# Patient Record
Sex: Male | Born: 1960 | Race: White | Hispanic: No | Marital: Married | Smoking: Never smoker
Health system: Southern US, Community
[De-identification: ages and names within clinical notes are randomized; demographics above are authoritative.]

## PROBLEM LIST (undated history)

## (undated) DIAGNOSIS — D649 Anemia, unspecified: Secondary | ICD-10-CM

## (undated) DIAGNOSIS — D6861 Antiphospholipid syndrome: Secondary | ICD-10-CM

## (undated) DIAGNOSIS — M948X9 Other specified disorders of cartilage, unspecified sites: Secondary | ICD-10-CM

## (undated) DIAGNOSIS — G4730 Sleep apnea, unspecified: Secondary | ICD-10-CM

## (undated) DIAGNOSIS — J45909 Unspecified asthma, uncomplicated: Secondary | ICD-10-CM

## (undated) DIAGNOSIS — Z5189 Encounter for other specified aftercare: Secondary | ICD-10-CM

## (undated) DIAGNOSIS — Z86711 Personal history of pulmonary embolism: Secondary | ICD-10-CM

## (undated) HISTORY — PX: KNEE SURGERY: SHX244

## (undated) HISTORY — DX: Unspecified asthma, uncomplicated: J45.909

## (undated) HISTORY — DX: Antiphospholipid syndrome: D68.61

## (undated) HISTORY — PX: SHOULDER SURGERY: SHX246

## (undated) HISTORY — PX: APPENDECTOMY: SHX54

## (undated) HISTORY — PX: COLONOSCOPY: SHX174

## (undated) HISTORY — PX: CHOLECYSTECTOMY: SHX55

## (undated) HISTORY — DX: Anemia, unspecified: D64.9

## (undated) HISTORY — DX: Personal history of pulmonary embolism: Z86.711

## (undated) HISTORY — DX: Encounter for other specified aftercare: Z51.89

## (undated) HISTORY — DX: Sleep apnea, unspecified: G47.30

---
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
## 2004-03-09 ENCOUNTER — Ambulatory Visit (HOSPITAL_COMMUNITY): Admission: RE | Admit: 2004-03-09 | Discharge: 2004-03-09 | Payer: Self-pay | Admitting: Sports Medicine

## 2004-05-04 ENCOUNTER — Ambulatory Visit (HOSPITAL_BASED_OUTPATIENT_CLINIC_OR_DEPARTMENT_OTHER): Admission: RE | Admit: 2004-05-04 | Discharge: 2004-05-04 | Payer: Self-pay | Admitting: Orthopedic Surgery

## 2004-05-04 ENCOUNTER — Ambulatory Visit (HOSPITAL_COMMUNITY): Admission: RE | Admit: 2004-05-04 | Discharge: 2004-05-04 | Payer: Self-pay | Admitting: Orthopedic Surgery

## 2006-11-01 ENCOUNTER — Ambulatory Visit (HOSPITAL_COMMUNITY): Admission: RE | Admit: 2006-11-01 | Discharge: 2006-11-01 | Payer: Self-pay | Admitting: Emergency Medicine

## 2006-11-20 ENCOUNTER — Ambulatory Visit: Payer: Self-pay | Admitting: Internal Medicine

## 2006-12-11 ENCOUNTER — Ambulatory Visit: Payer: Self-pay | Admitting: Internal Medicine

## 2007-01-10 ENCOUNTER — Ambulatory Visit: Payer: Self-pay | Admitting: Internal Medicine

## 2007-02-10 ENCOUNTER — Ambulatory Visit: Payer: Self-pay | Admitting: Internal Medicine

## 2007-02-14 ENCOUNTER — Ambulatory Visit (HOSPITAL_COMMUNITY): Admission: RE | Admit: 2007-02-14 | Discharge: 2007-02-14 | Payer: Self-pay | Admitting: Internal Medicine

## 2007-03-12 ENCOUNTER — Ambulatory Visit: Payer: Self-pay | Admitting: Internal Medicine

## 2007-04-12 ENCOUNTER — Ambulatory Visit: Payer: Self-pay | Admitting: Internal Medicine

## 2007-05-13 ENCOUNTER — Ambulatory Visit: Payer: Self-pay | Admitting: Internal Medicine

## 2007-06-12 ENCOUNTER — Ambulatory Visit: Payer: Self-pay | Admitting: Internal Medicine

## 2007-07-13 ENCOUNTER — Ambulatory Visit: Payer: Self-pay | Admitting: Internal Medicine

## 2007-08-12 ENCOUNTER — Ambulatory Visit: Payer: Self-pay | Admitting: Internal Medicine

## 2007-08-15 ENCOUNTER — Encounter: Payer: Self-pay | Admitting: Internal Medicine

## 2007-08-15 ENCOUNTER — Ambulatory Visit: Payer: Self-pay | Admitting: Gastroenterology

## 2007-09-11 ENCOUNTER — Encounter: Payer: Self-pay | Admitting: Internal Medicine

## 2007-09-11 ENCOUNTER — Observation Stay: Payer: Self-pay | Admitting: Internal Medicine

## 2007-09-12 ENCOUNTER — Ambulatory Visit: Payer: Self-pay | Admitting: Internal Medicine

## 2007-09-12 DIAGNOSIS — M948X9 Other specified disorders of cartilage, unspecified sites: Secondary | ICD-10-CM

## 2007-09-12 HISTORY — DX: Other specified disorders of cartilage, unspecified sites: M94.8X9

## 2007-09-16 ENCOUNTER — Encounter: Payer: Self-pay | Admitting: Internal Medicine

## 2007-09-16 ENCOUNTER — Ambulatory Visit (HOSPITAL_COMMUNITY): Admission: RE | Admit: 2007-09-16 | Discharge: 2007-09-16 | Payer: Self-pay | Admitting: Internal Medicine

## 2007-09-17 ENCOUNTER — Ambulatory Visit: Payer: Self-pay | Admitting: Vascular Surgery

## 2007-09-17 ENCOUNTER — Ambulatory Visit (HOSPITAL_COMMUNITY): Admission: RE | Admit: 2007-09-17 | Discharge: 2007-09-17 | Payer: Self-pay | Admitting: Internal Medicine

## 2007-09-23 ENCOUNTER — Encounter: Payer: Self-pay | Admitting: Internal Medicine

## 2007-09-24 ENCOUNTER — Encounter: Payer: Self-pay | Admitting: Internal Medicine

## 2007-09-24 ENCOUNTER — Ambulatory Visit (HOSPITAL_COMMUNITY): Admission: RE | Admit: 2007-09-24 | Discharge: 2007-09-24 | Payer: Self-pay | Admitting: Internal Medicine

## 2007-10-01 ENCOUNTER — Encounter: Payer: Self-pay | Admitting: Internal Medicine

## 2007-10-02 ENCOUNTER — Encounter: Payer: Self-pay | Admitting: Internal Medicine

## 2007-10-08 ENCOUNTER — Encounter: Payer: Self-pay | Admitting: Internal Medicine

## 2007-10-13 ENCOUNTER — Ambulatory Visit: Payer: Self-pay | Admitting: Internal Medicine

## 2007-11-01 ENCOUNTER — Ambulatory Visit: Payer: Self-pay | Admitting: Internal Medicine

## 2007-11-08 ENCOUNTER — Inpatient Hospital Stay (HOSPITAL_COMMUNITY): Admission: EM | Admit: 2007-11-08 | Discharge: 2007-11-09 | Payer: Self-pay | Admitting: Emergency Medicine

## 2007-11-10 ENCOUNTER — Ambulatory Visit: Payer: Self-pay | Admitting: Internal Medicine

## 2007-11-12 ENCOUNTER — Encounter: Payer: Self-pay | Admitting: Internal Medicine

## 2007-12-11 ENCOUNTER — Ambulatory Visit: Payer: Self-pay | Admitting: Internal Medicine

## 2008-01-10 ENCOUNTER — Ambulatory Visit: Payer: Self-pay | Admitting: Internal Medicine

## 2008-02-10 ENCOUNTER — Ambulatory Visit: Payer: Self-pay | Admitting: Internal Medicine

## 2008-02-18 ENCOUNTER — Encounter: Payer: Self-pay | Admitting: Internal Medicine

## 2008-02-19 IMAGING — CT CT CHEST W/ CM
3 series · 18 of 29 positions shown, 19 images · IV contrast (100 ML OMNI 300)
Comparison: Prior CTs of the chest, abdomen, and pelvis done on 02/14/07. There are no chest radiographs available for correlation.

CLINICAL DATA: Hemolytic anemia and fever.  Right pleural based nodule on chest x-ray 09/11/07.
CHEST CT WITH CONTRAST ? 09/16/07:
TECHNIQUE: Multidetector CT imaging of the chest was performed following the standard protocol during bolus administration of intravenous contrast.   
Contrast: 100 cc Omnipaque 300 IV.

[Series 2: routine chest · axial · 0.76mm/px · z∈[-270,-106]mm · 4 of 67 slices shown, 5 images]
[im 17/67  mediastinal]
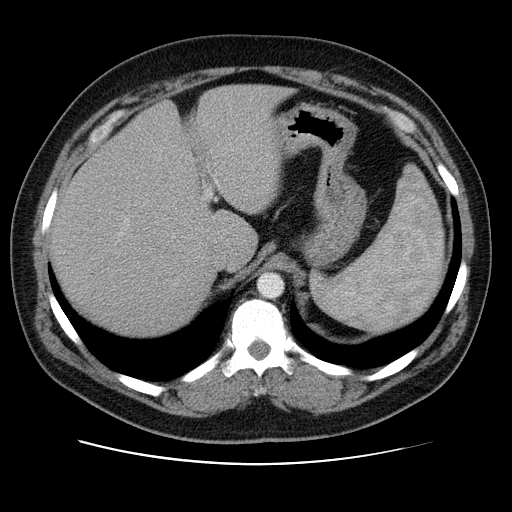
[im 17/67  lung]
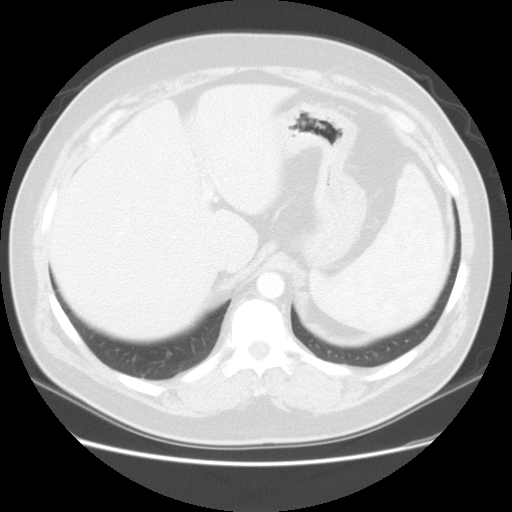
[im 34/67  lung]
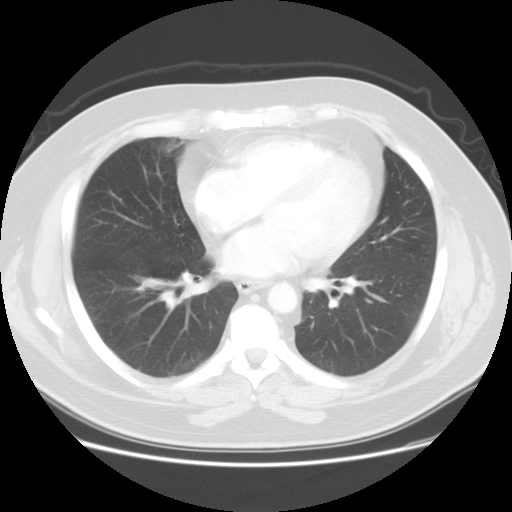
[im 36/67  lung]
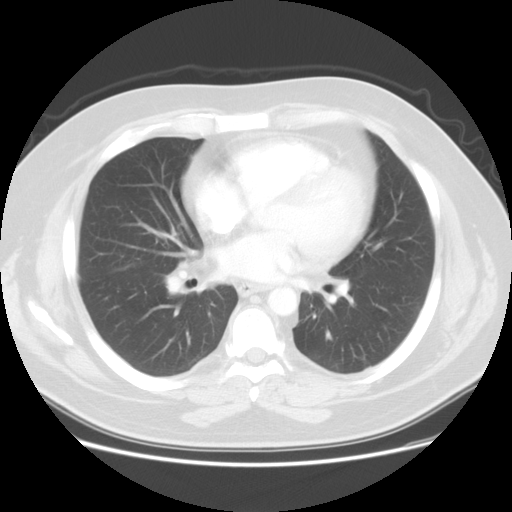
[im 50/67  lung]
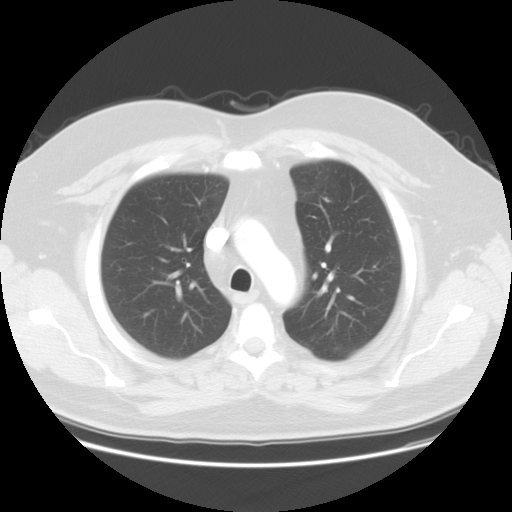

[Series 400: reformatted · coronal · 0.76mm/px · 6 of 154 slices shown (1 of 2)]
[im 18/154  lung]
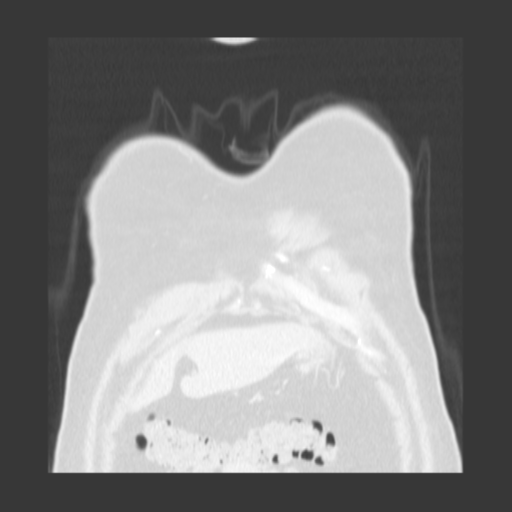
[im 35/154  lung]
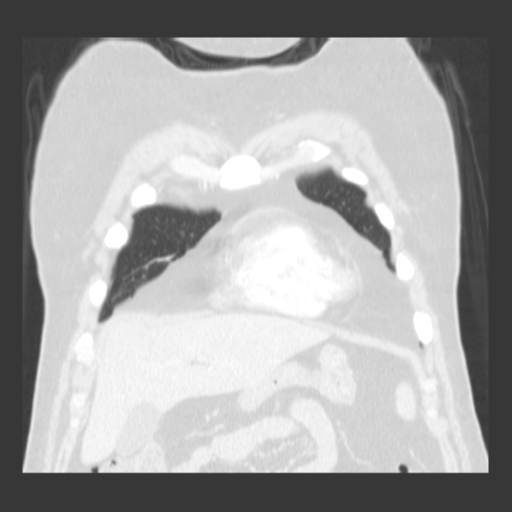
[im 52/154  lung]
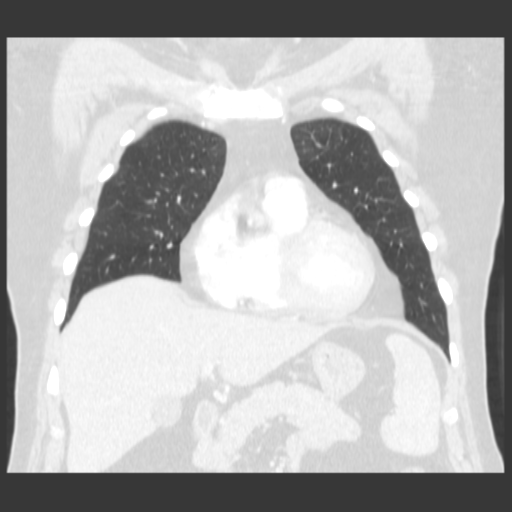
[im 69/154  lung]
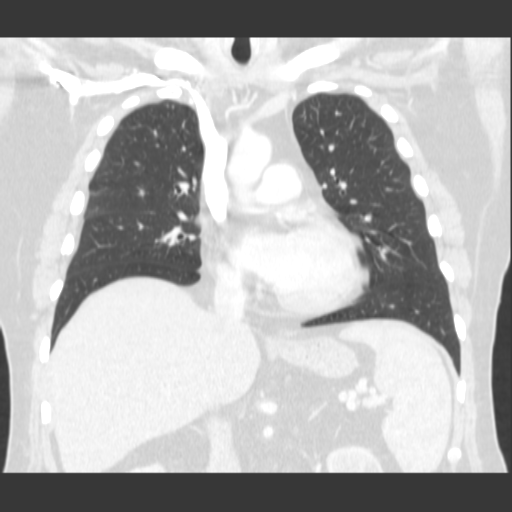
[im 86/154  lung]
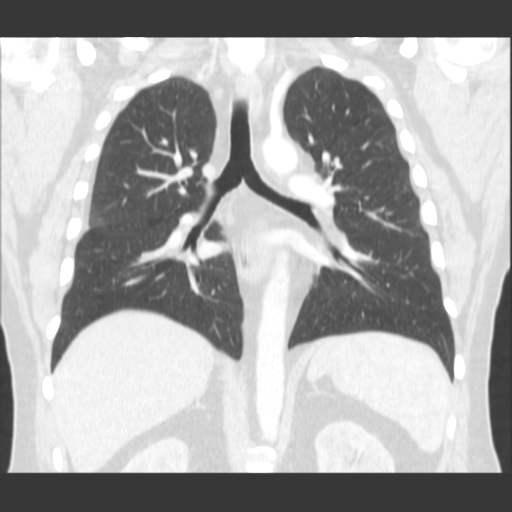
[im 103/154  lung]
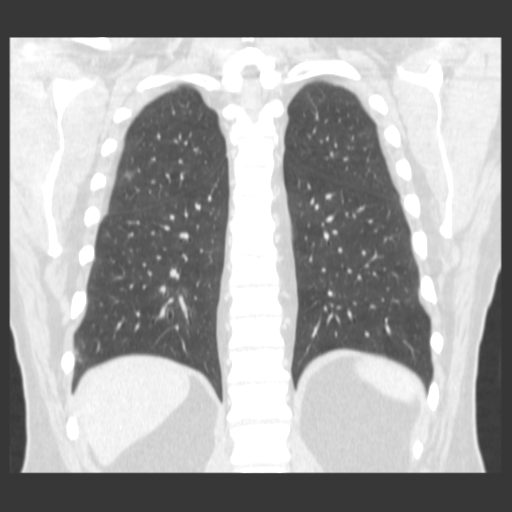

[Series 401: reformatted · sagittal · 0.76mm/px · 8 of 194 slices shown (2 of 2)]
[im 17/194  lung]
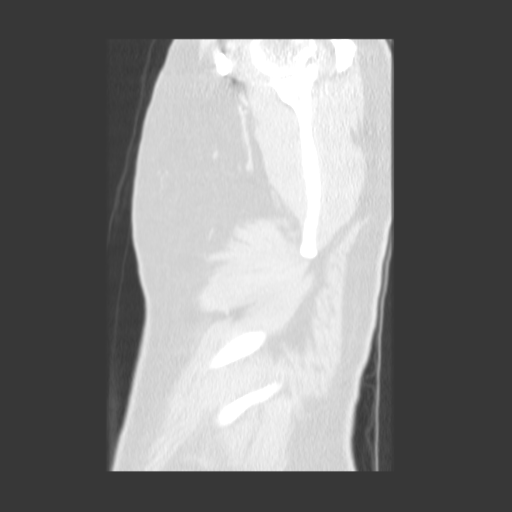
[im 49/194  lung]
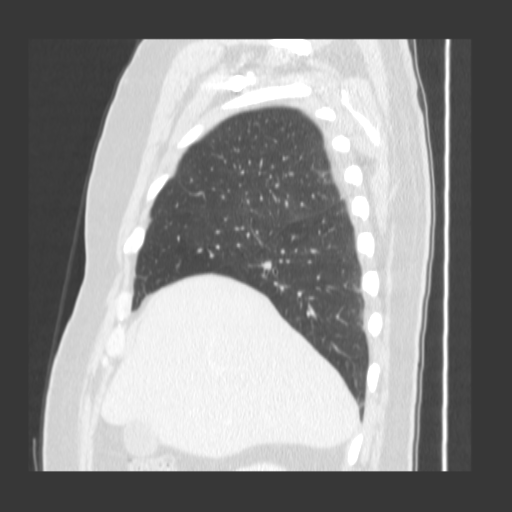
[im 65/194  lung]
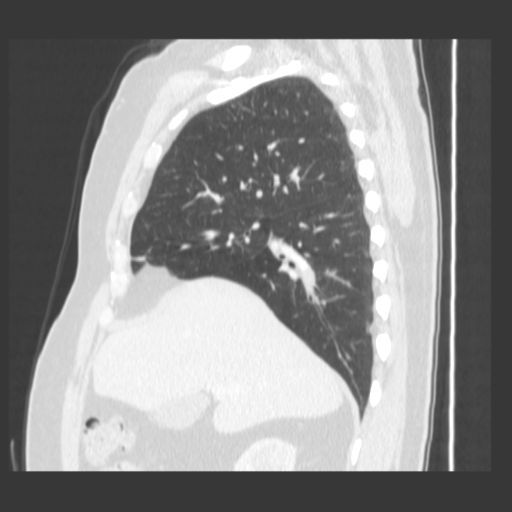
[im 81/194  lung]
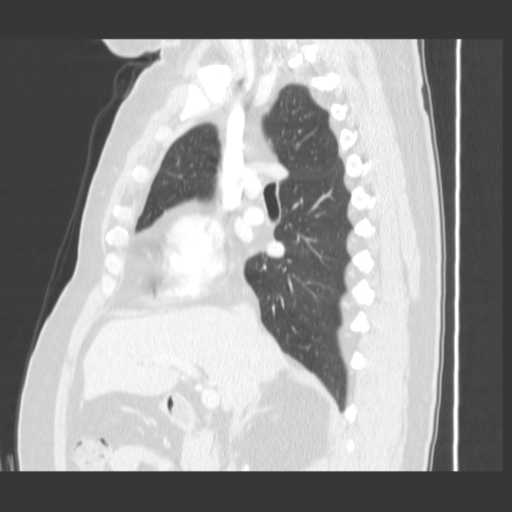
[im 113/194  lung]
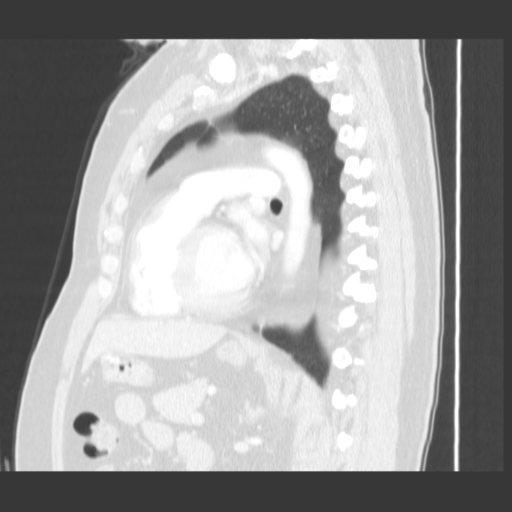
[im 129/194  lung]
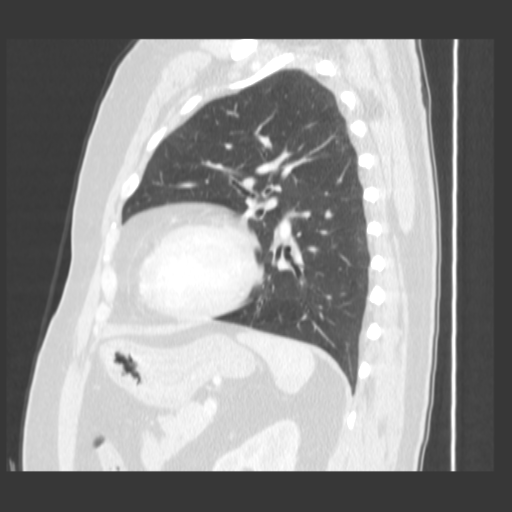
[im 145/194  lung]
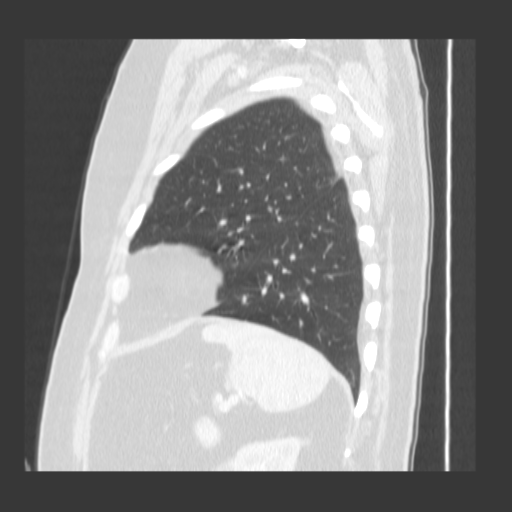
[im 177/194  lung]
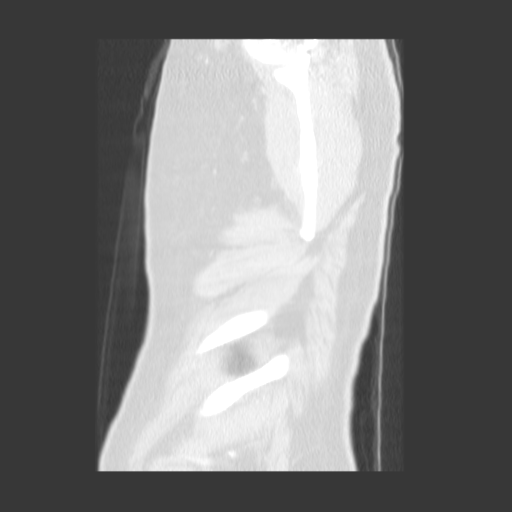

[18 of 29 positions shown; findings below may reference images not displayed]

FINDINGS: A new peripheral subpleural nodule is noted in the right upper lobe just above the minor fissure. This measures 1.7 x 0.9  cm transverse and is triangular in shape on the reformatted images.  Additional new smaller areas of subpleural density are present in the right lower lobe.  The left lung has a stable appearance. There is a small nodule medially in the left lower lobe on image #38 which is unchanged.
Although this study was not specifically designed to evaluate the pulmonary arteries, there is poor opacification of a right lower lobe pulmonary arterial branch on images #36-41. The vessel appears mildly expanded. This is confirmed on the reformatted images and compatible with pulmonary embolism, probably subacute in age.  No central pulmonary emboli are evident.  
There is no pleural or pericardial effusion.  No enlarged mediastinal or hilar lymph nodes are seen.
Images through the upper abdomen demonstrate no acute findings.  A cystic lesion in the midleft kidney is unchanged.
IMPRESSION: 1.  Pulmonary embolism on the right, probably subacute in age.
2.  Scattered subpleural nodular densities in the right upper and lower lobes are noted, probably reflecting pulmonary infarcts.  
3.  No adenopathy or other findings are demonstrated to suggest lymphoma.
4.  Dr. Klark shortly after completion of the examination.

## 2008-02-27 ENCOUNTER — Encounter: Payer: Self-pay | Admitting: Internal Medicine

## 2008-03-02 ENCOUNTER — Ambulatory Visit: Payer: Self-pay | Admitting: Family Medicine

## 2008-03-02 ENCOUNTER — Inpatient Hospital Stay (HOSPITAL_COMMUNITY): Admission: EM | Admit: 2008-03-02 | Discharge: 2008-03-05 | Payer: Self-pay | Admitting: Emergency Medicine

## 2008-03-05 ENCOUNTER — Encounter: Payer: Self-pay | Admitting: Internal Medicine

## 2008-03-11 ENCOUNTER — Ambulatory Visit: Payer: Self-pay | Admitting: Internal Medicine

## 2008-04-02 ENCOUNTER — Encounter: Payer: Self-pay | Admitting: Internal Medicine

## 2008-04-11 ENCOUNTER — Ambulatory Visit: Payer: Self-pay | Admitting: Internal Medicine

## 2008-05-12 ENCOUNTER — Ambulatory Visit: Payer: Self-pay | Admitting: Internal Medicine

## 2008-05-22 ENCOUNTER — Telehealth: Payer: Self-pay | Admitting: Internal Medicine

## 2008-06-08 ENCOUNTER — Ambulatory Visit: Payer: Self-pay | Admitting: Internal Medicine

## 2008-06-08 DIAGNOSIS — D591 Autoimmune hemolytic anemia, unspecified: Secondary | ICD-10-CM | POA: Insufficient documentation

## 2008-06-08 DIAGNOSIS — R197 Diarrhea, unspecified: Secondary | ICD-10-CM | POA: Insufficient documentation

## 2008-06-08 DIAGNOSIS — Z86718 Personal history of other venous thrombosis and embolism: Secondary | ICD-10-CM

## 2008-06-08 DIAGNOSIS — K649 Unspecified hemorrhoids: Secondary | ICD-10-CM | POA: Insufficient documentation

## 2008-06-09 ENCOUNTER — Encounter: Payer: Self-pay | Admitting: Internal Medicine

## 2008-06-11 ENCOUNTER — Ambulatory Visit: Payer: Self-pay | Admitting: Internal Medicine

## 2008-06-12 ENCOUNTER — Telehealth: Payer: Self-pay | Admitting: Internal Medicine

## 2008-06-12 ENCOUNTER — Ambulatory Visit: Payer: Self-pay | Admitting: Internal Medicine

## 2008-06-15 ENCOUNTER — Ambulatory Visit: Payer: Self-pay | Admitting: Internal Medicine

## 2008-06-15 ENCOUNTER — Telehealth: Payer: Self-pay | Admitting: Internal Medicine

## 2008-06-16 ENCOUNTER — Encounter: Payer: Self-pay | Admitting: Internal Medicine

## 2008-06-23 ENCOUNTER — Encounter: Payer: Self-pay | Admitting: Internal Medicine

## 2008-06-24 LAB — CONVERTED CEMR LAB: Tissue Transglutaminase Ab, IgA: 0.2 units (ref <7)

## 2008-06-26 ENCOUNTER — Telehealth: Payer: Self-pay | Admitting: Internal Medicine

## 2008-07-06 ENCOUNTER — Ambulatory Visit: Payer: Self-pay | Admitting: Internal Medicine

## 2008-07-09 ENCOUNTER — Telehealth: Payer: Self-pay | Admitting: Internal Medicine

## 2008-07-12 ENCOUNTER — Ambulatory Visit: Payer: Self-pay | Admitting: Internal Medicine

## 2008-07-14 ENCOUNTER — Encounter: Payer: Self-pay | Admitting: Internal Medicine

## 2008-07-14 ENCOUNTER — Telehealth: Payer: Self-pay | Admitting: Internal Medicine

## 2008-07-14 ENCOUNTER — Ambulatory Visit: Payer: Self-pay | Admitting: Internal Medicine

## 2008-07-16 ENCOUNTER — Telehealth: Payer: Self-pay | Admitting: Internal Medicine

## 2008-07-17 ENCOUNTER — Encounter: Payer: Self-pay | Admitting: Internal Medicine

## 2008-07-30 ENCOUNTER — Ambulatory Visit: Payer: Self-pay | Admitting: Oncology

## 2008-08-11 ENCOUNTER — Ambulatory Visit: Payer: Self-pay | Admitting: Internal Medicine

## 2008-08-13 ENCOUNTER — Encounter: Payer: Self-pay | Admitting: Internal Medicine

## 2008-08-28 ENCOUNTER — Encounter: Payer: Self-pay | Admitting: Internal Medicine

## 2008-09-11 ENCOUNTER — Ambulatory Visit: Payer: Self-pay | Admitting: Internal Medicine

## 2008-10-12 ENCOUNTER — Ambulatory Visit: Payer: Self-pay | Admitting: Internal Medicine

## 2008-10-13 ENCOUNTER — Encounter: Payer: Self-pay | Admitting: Internal Medicine

## 2008-12-10 ENCOUNTER — Encounter: Payer: Self-pay | Admitting: Internal Medicine

## 2008-12-16 ENCOUNTER — Ambulatory Visit: Payer: Self-pay | Admitting: Internal Medicine

## 2009-01-09 ENCOUNTER — Ambulatory Visit: Payer: Self-pay | Admitting: Internal Medicine

## 2009-02-09 ENCOUNTER — Encounter: Payer: Self-pay | Admitting: Internal Medicine

## 2009-06-29 ENCOUNTER — Encounter: Payer: Self-pay | Admitting: Internal Medicine

## 2009-10-01 ENCOUNTER — Ambulatory Visit: Payer: Self-pay | Admitting: Internal Medicine

## 2009-11-12 ENCOUNTER — Encounter (INDEPENDENT_AMBULATORY_CARE_PROVIDER_SITE_OTHER): Payer: Self-pay | Admitting: Surgery

## 2009-11-12 ENCOUNTER — Ambulatory Visit (HOSPITAL_COMMUNITY): Admission: RE | Admit: 2009-11-12 | Discharge: 2009-11-14 | Payer: Self-pay | Admitting: Surgery

## 2009-11-13 ENCOUNTER — Ambulatory Visit: Payer: Self-pay | Admitting: Gastroenterology

## 2009-12-14 ENCOUNTER — Encounter: Payer: Self-pay | Admitting: Gastroenterology

## 2010-01-27 ENCOUNTER — Encounter: Payer: Self-pay | Admitting: Internal Medicine

## 2010-02-10 ENCOUNTER — Ambulatory Visit: Payer: Self-pay | Admitting: Oncology

## 2010-02-27 ENCOUNTER — Encounter: Payer: Self-pay | Admitting: Internal Medicine

## 2010-02-28 ENCOUNTER — Telehealth: Payer: Self-pay | Admitting: Internal Medicine

## 2010-03-01 ENCOUNTER — Encounter: Payer: Self-pay | Admitting: Internal Medicine

## 2010-03-09 ENCOUNTER — Encounter (HOSPITAL_COMMUNITY): Admission: RE | Admit: 2010-03-09 | Discharge: 2010-05-20 | Payer: Self-pay | Admitting: Internal Medicine

## 2010-03-10 ENCOUNTER — Telehealth: Payer: Self-pay | Admitting: Internal Medicine

## 2010-10-02 ENCOUNTER — Encounter: Payer: Self-pay | Admitting: Sports Medicine

## 2010-10-12 NOTE — Procedures (Signed)
Summary: ERCP  Patient: [name] Note: All result statuses are Final unless otherwise noted.  Tests: (1) ERCP (ERC)   ERC ERCP                  DONE     [facility]     [address]     [place], [place]  [address]           ERCP PROCEDURE REPORT           PATIENT:  Mark Randolph, Mark Randolph  MR#:  [id number]     BIRTHDATE:  08-10-1961  GENDER:  male           ENDOSCOPIST:  Venita Lick. Russella Dar, MD, [facility]           PROCEDURE DATE:  11/13/2009     PROCEDURE:  ERCP with sphincterotomy, ERCP with removal of stones           INDICATIONS:  choledocholithiasis on IOC, elevated transaminases                 MEDICATIONS:  Fentanyl 75 mcg IV, Versed 9 mg IV, glucagon 0.25 mg     IV, Benadryl 37.5 mg IV     TOPICAL ANESTHETIC:  Cetacaine Spray           DESCRIPTION OF PROCEDURE:   After the risks benefits and     alternatives of the procedure were thoroughly explained, informed     consent was obtained.  The Pentax ED-[id number] endoscope was     introduced through the mouth and advanced to the second portion of     the duodenum.           A normal appearing ampulla was visualized. There was no evidence     of papillitis or any trauma to the ampulla. Sphincterotomy was     performed with a regular 30 mm pappillotome using guidewire     technique.  Multiple (3) stones were found in the common bile duct     measuitn about 4mm each. A stone retrieval balloon was passed and     all apparent stones removed.  The intrahepatic and extrahepatic     bile ducts were otherwise normal. Cholecystectomy was noted.     Excellent billary drainage was noted. The scope was then     completely withdrawn from the patient and the procedure completed.     <<PROCEDUREIMAGES>>           COMPLICATIONS:  None           ENDOSCOPIC IMPRESSION:     1) Normal ampulla     2) Stones, multiple in the common bile duct     3) Cholecystectomy           RECOMMENDATIONS:     1) liver enzymes in 4  weeks           Malcolm T. Russella Dar, MD, Clement[facility] Graham           CC:  Cyndia Bent, MD     Stan Head, MD           n.     Rosalie DoctorVenita Lick. Stark at 11/13/2009 09:43 AM           Shelly Rubenstein, [id number]  Note: An exclamation mark (!) indicates a result that was not dispersed into the flowsheet. Document Creation Date: 11/13/2009 9:44 AM _______________________________________________________________________  (1) Order result status: Final Collection or observation date-time: 11/13/2009 09:32  Requested date-time:  Receipt date-time:  Reported date-time:  Referring Physician:   Ordering Physician: Claudette Head [phone]) Specimen Source:  Source: Launa Grill Order Number: [id number] Lab site:

## 2010-10-12 NOTE — Letter (Signed)
Summary: [facility]   Imported By: Sherian Rein 01/04/2010 13:41:32  _____________________________________________________________________  External Attachment:    Type:   Image     Comment:   External Document

## 2010-10-12 NOTE — Letter (Signed)
Summary: External Correspondence  External Correspondence   Imported By: Georgian Co 03/01/2010 08:56:06  _____________________________________________________________________  External Attachment:    Type:   Image     Comment:   External Document

## 2010-10-12 NOTE — Letter (Signed)
Summary: [facility]  [facility]   Imported By: Lester [place] 03/15/2010 09:10:02  _____________________________________________________________________  External Attachment:    Type:   Image     Comment:   External Document

## 2010-10-12 NOTE — Letter (Signed)
Summary: [facility]  [facility]   Imported By: Lester [place] 03/15/2010 09:11:00  _____________________________________________________________________  External Attachment:    Type:   Image     Comment:   External Document

## 2010-10-12 NOTE — Progress Notes (Signed)
Summary: ? pt needs prior auth  Phone Note Call from Patient Call back at [phone]   Caller: colleen bukowski--nurse at [facility] Complaint: Cough/Sore throat Summary of Call: nurse case manager at [facility] needs prior auth for Rituxin. She stated that this pt sees Dr Cato Mulligan. Please verify. Initial call taken by: Warnell Forester,  February 28, 2010 3:06 PM  Follow-up for Phone Call        discussed with [facility] physician

## 2010-10-12 NOTE — Progress Notes (Signed)
Summary: FYI  Phone Note Call from Patient   Caller: Patient Call For: Dr. Cato Mulligan Summary of Call: Pt is calling to remind Dr. Cato Mulligan to set up his second Rituxan infusion. Initial call taken by: Lynann Beaver CMA,  March 10, 2010 8:24 AM  Follow-up for Phone Call        see original order Follow-up by: Birdie Sons MD,  March 12, 2010 6:27 PM  Additional Follow-up for Phone Call Additional follow up Details #1::        Sent Physician's Order to [facility] for second dose (as required).  Pt may call on 03/16/10 to set up appt after 03/23/10.  LMOM for pt.  Additional Follow-up by: Corky Mull,  March 15, 2010 4:08 PM

## 2010-10-12 NOTE — Letter (Signed)
Summary: [facility]  [facility]   Imported By: Lester Max 03/15/2010 09:12:18  _____________________________________________________________________  External Attachment:    Type:   Image     Comment:   External Document

## 2010-11-30 LAB — CBC
HCT: 49.4 % (ref 39.0–52.0)
MCV: 89.4 fL (ref 78.0–100.0)
Platelets: 151 10*3/uL (ref 150–400)
RDW: 12.7 % (ref 11.5–15.5)

## 2010-11-30 LAB — COMPREHENSIVE METABOLIC PANEL
CO2: 27 mEq/L (ref 19–32)
Calcium: 9.2 mg/dL (ref 8.4–10.5)
Creatinine, Ser: 0.89 mg/dL (ref 0.4–1.5)
GFR calc Af Amer: >60 mL/min (ref >60)
GFR calc non Af Amer: >60 mL/min (ref >60)
Glucose, Bld: 91 mg/dL (ref 70–99)

## 2010-11-30 LAB — URINALYSIS, ROUTINE W REFLEX MICROSCOPIC
Leukocytes, UA: NEGATIVE
Nitrite: NEGATIVE
Specific Gravity, Urine: 1.008 (ref 1.005–1.030)
pH: 5.5 (ref 5.0–8.0)

## 2010-11-30 LAB — DIFFERENTIAL
Lymphocytes Relative: 13 % (ref 12–46)
Lymphs Abs: 0.6 10*3/uL — ABNORMAL LOW (ref 0.7–4.0)
Neutrophils Relative %: 75 % (ref 43–77)

## 2010-11-30 LAB — LIPASE, BLOOD: Lipase: 33 U/L (ref 11–59)

## 2010-12-04 LAB — HEPATIC FUNCTION PANEL
ALT: 153 U/L — ABNORMAL HIGH (ref 0–53)
AST: 56 U/L — ABNORMAL HIGH (ref 0–37)
Albumin: 3.5 g/dL (ref 3.5–5.2)
Albumin: 3.6 g/dL (ref 3.5–5.2)
Alkaline Phosphatase: 82 U/L (ref 39–117)
Bilirubin, Direct: 0.3 mg/dL (ref 0.0–0.3)
Total Protein: 5.7 g/dL — ABNORMAL LOW (ref 6.0–8.3)

## 2010-12-04 LAB — CBC
MCHC: 35.3 g/dL (ref 30.0–36.0)
RBC: 4.93 MIL/uL (ref 4.22–5.81)
WBC: 6.2 10*3/uL (ref 4.0–10.5)

## 2010-12-04 LAB — LIPASE, BLOOD: Lipase: 15 U/L (ref 11–59)

## 2011-01-24 NOTE — Discharge Summary (Signed)
NAMECAEDON, BOND NO.:  [id number]   MEDICAL RECORD NO.:  [id number]          PATIENT TYPE:  INP   LOCATION:  2917                         FACILITY:  [facility]   PHYSICIAN:  Nestor Ramp, MD        DATE OF BIRTH:  Sep 23, 1960   DATE OF ADMISSION:  03/02/2008  DATE OF DISCHARGE:  03/05/2008                               DISCHARGE SUMMARY   ADDENDUM TO DISCHARGE SUMMARY:  Instead of being transferred to [facility], the  patient was discharged to home with followup appointment this morning at  11:30 with Dr. Alberteen Spindle at [facility].   New discharge labs:  Platelets 82, hemoglobin 10.3, white blood cell  6.9.   HOSPITAL COURSE:  1. Perichondritis and purpura of the ear bilaterally, less      inflammation,  purpura improved prior to discharge.  The patient      tolerated without pain.  Last pain medication was the night prior      to discharge.  The patient to be sent home with Percocet for pain      and with close followup with Dr. Alberteen Spindle of nephrology , Dr Isaiah Serge of      hem/onc and Dr Zigmund Gottron of rheumatology, all at [facility]..  2. Headache, resolved.  3. Thrombocytopenia.  Platelets on admission were 135 and dropped to      78.  Prior to discharge platelets were stable at 82.  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      Eustaquio Boyden, MD  Electronically Signed      Nestor Ramp, MD  Electronically Signed    JG/MEDQ  D:  03/05/2008  T:  03/05/2008  Job:  [id number]

## 2011-01-24 NOTE — Discharge Summary (Signed)
NAMEBECKHEM, ISADORE NO.:  [id number]   MEDICAL RECORD NO.:  [id number]          PATIENT TYPE:  INP   LOCATION:  2917                         FACILITY:  [facility]   PHYSICIAN:  Nestor Ramp, MD        DATE OF BIRTH:  11/05/1960   DATE OF ADMISSION:  03/02/2008  DATE OF DISCHARGE:  03/04/2008                               DISCHARGE SUMMARY   ADDENDUM TO DISCHARGE SUMMARY:  This is an addendum to previously-  dictated discharge summary.   DISCHARGE MEDICATIONS:  Medications while in hospital:   1. Lovenox 100 mg subcutaneously b.i.d.  2. Solu-Medrol 125 mg IV daily.  3. Dilaudid 2 mg IV q.2-4 h. p.r.n. pain.  4. Atarax 50 mg p.o. q.6 h. p.r.n. pruritus.  5. Zofran 4 mg IV q.4 h. p.r.n. nausea.  6. Phenergan 12.5 mg p.o. q.4 h. p.r.n. nausea.  7. Ambien 5 mg p.o. 1 p.o. nightly p.r.n. insomnia.      Eustaquio Boyden, MD  Electronically Signed      Nestor Ramp, MD  Electronically Signed    JG/MEDQ  D:  03/04/2008  T:  03/04/2008  Job:  [id number]

## 2011-01-24 NOTE — H&P (Signed)
NAMEMARLOWE, LAWES NO.:  [id number]   MEDICAL RECORD NO.:  [id number]          PATIENT TYPE:  INP   LOCATION:  1523                         FACILITY:  [facility]   PHYSICIAN:  Hillery Aldo, M.D.   DATE OF BIRTH:  03/13/1961   DATE OF ADMISSION:  11/08/2007  DATE OF DISCHARGE:  11/09/2007                              HISTORY & PHYSICAL   PRIMARY CARE PHYSICIAN:  Dr. Sherrlyn Hock, hematologist.   GASTROENTEROLOGIST:  Dr. Leone Payor.   CHIEF COMPLAINT:  Abdominal pain.   HISTORY OF PRESENT ILLNESS:  The patient is a 50 year old male with a  past medical history of hemolytic anemia, for which he sees Dr. Sherrlyn Hock.  He presents to the emergency department today with a 2-week history of  nausea with occasional vomiting.  Over the past 24 hours, the patient  has developed sharp constant right flank and periumbilical pain, rated  10/10.  The patient denies any past medical history of nephrolithiasis,  but nevertheless, he has evidence of distal left ureteral stones on CT  scanning, as well as a left-sided pyelonephritis.  The patient is on  chronic Coumadin for history of pulmonary embolism and has chronic  hemolytic anemia, with his current hemoglobin at 7.2.  Reportedly, he  had a capsule endoscopy last week to rule out AVM, due to his  chronically low hemoglobin in the setting of Coumadin use.  He is being  admitted for further evaluation and workup, as well as pain and nausea  control.   PAST MEDICAL HISTORY:  1. Pulmonary embolism diagnosed December 2008, on chronic Coumadin.  2. History of hemolytic anemia, status post bone marrow biopsy x2.  3. History of lupus anticoagulant positivity.   PAST SURGICAL HISTORY:  1. Left shoulder rotator cuff tear, status post debridement.  2. Status post appendectomy.  3. Status post left-sided knee surgery.   FAMILY HISTORY:  The patient's mother is alive at 60 and has celiac  disease, as well as bladder cancer.  The patient's  father died at 66  from pancreatic cancer.  The patient has one healthy brother, one sister  with thyroid disease, and one sister with breast cancer.   SOCIAL HISTORY:  The patient is married.  He has a remote history of  social tobacco use but none anytime recent.  He drinks alcohol socially  and rarely.  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   ALLERGIES:  NO KNOWN DRUG ALLERGIES.   MEDICATIONS:  1. Coumadin 5 mg daily, 7.5 mg q. Tuesday and Thursday.  2. Prednisone 10 mg daily.  3. Zofran p.r.n.  4. Prilosec 20 mg daily.   REVIEW OF SYSTEMS:  The patient reports sporadic fevers over the past 2  weeks.  His appetite has been diminished for approximately one month,  and he has lost 11 pounds of weight in that time.  Denies chest pain.  He has had some dyspnea related to his recent diagnosis of pulmonary  embolism but no cough.  He has had some recent diarrhea and background  setting of chronic constipation.  He  has not noticed any specific melena  or hematochezia, but does report that he did have a heme-positive stool  test done, prompting Dr. Leone Payor to order the capsule endoscopy.   PHYSICAL EXAM:  VITAL SIGNS:  Temperature 98.4, pulse 100, respirations  15, blood pressure 135/81, O2 saturation 99% ON 2 liters.  GENERAL:  Obese male in mild distress from active nausea and vomiting  and right-sided abdominal pain.  HEENT:  Normocephalic, atraumatic.  Capsule PERRL.  Oropharynx is clear.  NECK:  Supple, no thyromegaly.  Questionable minor anterior cervical  lymphadenopathy.  CHEST:  Lungs clear to auscultation bilaterally with good air movement.  HEART:  Regular rate, rhythm.  No murmurs, rubs or gallops.  ABDOMEN:  Soft.  Decreased bowel sounds.  He is tender about the  periumbilical and right flank areas.  EXTREMITIES:  1+ edema bilaterally.  SKIN:  Warm and dry.  No rashes.  NEUROLOGIC:  The patient is alert and oriented.   Nonfocal.   DATA:  Reviewed.   CT scan of the abdomen and pelvis shows decreased cortical enhancement  and contrast excretion by the left kidney, which is mildly  hydronephrotic with perinephric fat stranding.  There is tiny  hyperdensities in the distal left ureter, suspicious for obstructive  uropathy secondary to tiny distal left ureteral stones.   LABORATORY DATA:  White blood cell count is 2.3, hemoglobin 7.2,  hematocrit 20, platelets 324 with an MCV of 88.1.  Sodium is 137,  potassium 4.0, chloride 102, bicarb 28, BUN 15, creatinine 1.31, glucose  140, total bilirubin 1.3, albumin 3.3.  Liver function studies were  otherwise within normal limits.  INR is 2.7.  Lipase 22.  Urinalysis  shows significant hematuria with 21 to 50 red blood cells.   ASSESSMENT/PLAN:  1. Nephrolithiasis with obstructive uropathy:  The patient will be      admitted and hydrated.  A telephone consultation has been made with      Dr. Wanda Plump of urology, who recommends initiating the patient on      Flomax.  He is not felt to be an immediate candidate for stenting,      given his chronic Coumadin therapy and immunosuppressive      treatments.  A trial of conservative therapy is warranted in this      case, and therefore we will attempt to hydrate him and monitor him      closely.  We will put him on Flomax as recommended.  2. Left-sided pyelonephritis:  Send a urine for culture and      empirically start the patient on Rocephin 1 gram IV daily.  3. Hemolytic anemia:  The patient has an outpatient hematologist who      follows him closely for this.  I suspect that his current anemia is      due to underlying hemolysis in the setting of possible chronic      gastrointestinal blood losses and chronic Coumadin administration.      The patient does not have an elevated BUN to suggest an upper      gastrointestinal hemorrhage.  He has recently underwent capsule      endoscopy, and we can contact Dr. tested  for those results on      Monday.  Would transfuse 2 units packed red blood cells and monitor      his hemoglobin and hematocrit closely.  4. Pulmonary embolism:  Resume patient's Coumadin with dosing per      pharmacy.  5. Prophylaxis:  Place the patient on IV Protonix, given his nausea      and vomiting and he is currently anticoagulated on Coumadin for      known pulmonary embolism.      Hillery Aldo, M.D.  Electronically Signed     CR/MEDQ  D:  11/08/2007  T:  11/09/2007  Job:  [id number]   cc:   Dr. Sherrlyn Hock, Hematologist   Iva Boop, MD,FACG  [facility]  [address] [place], [place] [address]

## 2011-01-24 NOTE — H&P (Signed)
NAMEANTONIO, Mark Randolph NO.:  [id number]   MEDICAL RECORD NO.:  [id number]          PATIENT TYPE:  INP   LOCATION:  2917                         FACILITY:  [facility]   PHYSICIAN:  Nestor Ramp, MD        DATE OF BIRTH:  [birth year]   DATE OF ADMISSION:  03/02/2008  DATE OF DISCHARGE:                              HISTORY & PHYSICAL   CHIEF COMPLAINT:  Headache, nausea, vomiting, and ear purpura.   PRIMARY CARE PHYSICIAN:  1. Dr. Hyacinth Meeker at [facility] in [place].  2. Hematologist, Dr. Isaiah Serge at [facility].  3. Oncologist at [facility], Dr. Sherrlyn Hock.  4. Renal doctor, Dr. Alberteen Spindle, specialist at [facility].  5. Rheumatologist Dr Terie Purser at [facility]   HISTORY OF PRESENT ILLNESS:  This is a 50 year old male with history of  autoimmune hemolytic process who is currently being worked up by several  specialty clinics at [facility] who presented with a 1-week history of ear  purpura and 1-day history of headache along with nausea and vomiting.  This initially per patient started as what they thought was a sunburn  when they were outside over a week ago at a [facility] and golfing over the  next several days.  The sunburn that initially started in the ears  became purple.  They went to their PCP's office because it was painful  and it was believed that this was due to infection, so he was treated  with Levaquin.  The patient states that antibiotic did not improve  symptoms and then he started having nausea and vomiting.  The patient  seen by multiple specialists and was having workup done over the last  week with no identified etiology of this purpura.  Then yesterday, he  started having a frontal headache that was 10/10 and sharp.  The patient  denied weakness or numbness in the face.  He also states that ears  bilaterally are very tender.  They contacted their [facility] physician for  admission and evaluation, but there were no beds available, so they came  to [facility] for admission.  Of note,  the patient was on  Coumadin for history of PE, which was recently discontinued this week  and changed to Lovenox.   REVIEW OF SYSTEMS:  Also of note, in review of systems, the patient  states he has had hematuria several days prior to admission and ringing  in the ears over the last several weeks, otherwise negative.  No  abdominal pain.  No vision change.  The rest of complete 14 point review  of systems negative, except for as mentioned in HPI.   PAST MEDICAL HISTORY:  1. Hemolytic anemia this year.  The patient has been transfused every      3 weeks to maintain adequate hemoglobin levels, baseline of 7 and      9.  2. History of PE on Coumadin, now on lovenox  3. History of nephrolithiasis with obstructive uropathy in February of      this year.  4. 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  5. History of bone marrow biopsies, two in 2008, then again 2 weeks      ago.   ALLERGIES:  No known drug allergies.   MEDICATIONS:  1. Prednisone 5 mg daily, recently increased to 60 this week to try to      control ear symptoms.  2. Coumadin 5 mg daily, recently stopped this week.  3. Prilosec 20 mg daily.  4. Lovenox 100 mg b.i.d., recently started this week.  5. Percocet p.r.n. pain.  6. Topical lidocaine for ears, which has not helped.  7. A sleeping pill.   SOCIAL HISTORY:  The patient is married and has remote history of  tobacco use and socially drinks alcohol rarely.  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   FAMILY HISTORY:  History of bladder, prostate, and breast cancer also  history of thyroid disease and celiac disease.   PHYSICAL EXAMINATION:  VITAL SIGNS:  Temperature 97.7, heart rate 72,  respiratory rate 18, blood pressures 123/77-118/68-102/64 in the ER, O2  sat 98% on room air.  GENERAL:  Uncomfortable in pain with nausea.  HEENT:  Pupils equally round  and reactive to light.  Extraocular  movements intact.  Moist mucous membranes.  No pharyngeal erythema or  edema.  No lymphadenopathy noted.  Tympanic membranes clear.  For pinna  evaluation, see skin exam.  CARDIOVASCULAR:  Normal S1 and S2.  No murmurs, rubs, or gallops.  PULMONARY:  Clear to auscultation bilaterally.  No crackles or wheezing.  ABDOMEN:  Soft, nontender, and nondistended.  Normoactive bowel sounds.  No hepatosplenomegaly appreciated.  EXTREMITIES:  No clubbing, cyanosis, or edema.  2+ peripheral pulses.  NEURO:  Cranial nerves II though XII grossly intact as well as sensation  and motor, alert and oriented x3, responsive, and appropriate.  SKIN:  Bilateral pinnae with purpura.  Some dark black and purple spots,  which look like necrosis and they are exquisitely tender to touch.  The  patient also has an anterior chest rash, that is erythematous and  edematous approximately 2 x 4 cm, but nontender.  Also, on the left  posterior shoulder, he has a petechiae and purpura rash with some  erythema, that is not blanching.  No pallor, jaundiced.   LABORATORY DATA:  PT 17.3, PTT 140, INR 1.4.  I-STAT showing sodium 141,  potassium 3.2, chloride 104, bicarb 27, BUN 16, creatinine 0.9, glucose  115.  White blood cell 5.0, hemoglobin 8.0, hematocrit 22.4, platelets  132, neutrophils 87, lymphocytes 9, RDW 16.4, and MCV 85.   IMAGING:  Head CT showing no acute abnormality.   ASSESSMENT AND PLAN:  This is a 50 year old male with history of  antiphospholipid antibody syndrome and poorly defined systemic  autoimmune syndrome who presents with a 1-week history of ear purpura,  one-day history of headache and several-day history of nausea and  vomiting.  1. Ear purpura.  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  Given headache, concerned for increase intracranial      pressure, vasculitis  involvement;  normal CT scan.  Normal white      blood cell and no fever, findings to indicate bacterial infection.      We will treat with intravenous Solu-Medrol, increased steroid dose.      We will hold clindamycin for now, status post one dose of  clindamycin 300 mg p.o.  in the ED. We will admit to step-down unit      for further monitoring of clinical improvement.  Will treat pain      with Dilaudid and nausea with Zofran and Phenergan.  We anticipate,      transfer to [facility] later on in the week, as the patient has all his      specialists there and his history is better known.  We will check      liver function tests to rule out other hemolytic process.  2. Headache possibly secondary to vasculitis.  We will treat with      Dilaudid for now.  Monitor with neuro checks.  3. Nausea and vomiting.  Treat with Zofran and Phenergan p.r.n.  4. Hematuria per history.  We will check a urinalysis and urine      culture.  5. Anemia.  Baseline 7 and 9 per patient.  Currently, 8.0.  We will      continue to monitor if decreased.type and screen in preparation for      possible transfusion.  6. Fluids, electrolytes, nutrition.  Clears for now.  D5 half-normal      saline plus 20 of potassium at 125 mL/hour for maintenance.  7. Antiphospholipid antibody syndrome.  Continue on Lovenox at 100 mg      b.i.d.  8. Disposition.  Pending bed availability at [facility] for transfer.      Eustaquio Boyden, MD  Electronically Signed      Nestor Ramp, MD  Electronically Signed    JG/MEDQ  D:  03/02/2008  T:  03/03/2008  Job:  [id number]

## 2011-01-24 NOTE — Discharge Summary (Signed)
Mark, Randolph NO.:  [id number]   MEDICAL RECORD NO.:  [id number]          PATIENT TYPE:  INP   LOCATION:  2917                         FACILITY:  [facility]   PHYSICIAN:  Nestor Ramp, MD        DATE OF BIRTH:  December 27, 1960   DATE OF ADMISSION:  03/02/2008  DATE OF DISCHARGE:  03/04/2008                               DISCHARGE SUMMARY   TRANSFER DIAGNOSES:  1. Bilateral painful ear purpura with chondritis.  2. Autoimmune hemolytic anemia.  3. History of pulmonary embolism.  4. Antiphospholipid antibody syndrome.   IMAGING:  Head CT showing no acute intracranial abnormality.   ADMISSION LABS:  White blood cell 5.0, hemoglobin 8.0, hematocrit 22.4,  platelets 132, neutrophils 87% lymphocytes 9%, monocytes 4%, eosinophils  0%, PT 17.3, INR 1.4.  BMP with sodium 142, potassium 3.7, chloride 104,  bicarb 30, BUN 15, creatinine 0.81, total bilirubin 2.1, direct 0.4,  indirect 1.7, ALP 79, AST 13, ALT 17, total protein 5.9, albumin 3.3,  ESR 70, CRP 3.2.  Urinalysis with small bilirubin, otherwise  unremarkable.   DISCHARGE LABS:  Blood cultures no growth to date x2.  White blood cell  5.7, hemoglobin 8.2, hematocrit 23.6, platelets 78.  Prior platelet  value day prior to admission was 135.   HOSPITAL COURSE:  For full summary, please see dictated H and P.  In  short, this is a 50 year old gentleman with history of antiphospholipid  antibody syndrome and some sort of autoimmune hemolysis who presented  with 1 week history of ear complaints initially erythema and edema which  transitioned to purpura and very tender to touch.   1. Bilateral ear purpura with cartilage involvement suspected      (chondritis).  This complaint started around June 15.  It initially      started as erythema of the bilateral chondral pinna and slowly      transitioned to purpura.  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      The patient also noted rash and posterior left shoulder with      purpura nonblanching which has come and gone.  The patient has been      treated with IV Solu-Medrol 125 mg daily.  The patient was started      on Lovenox on June 18 and has had this continued during this      hospitalization at 100 mg b.i.d.  For headache associated with ear      pain, the patient has received Dilaudid 2 mg IV every 24 hours with      good resolution of pain.  For nausea and vomiting associated with      headache, the patient has been treated with Zofran and Phenergan      with good tolerance.  Unclear etiology as to cause of this purpura,      although it is believed that it is likely manifestation of his      autoimmune systemic process.  Of note, the patient has an  autoimmune process that is targeting erythroid and myeloid      precursors and previous bone marrow biopsy had showed hypercellular      marrow.  The patient's Hem/Onc doctor was contacted who believes      that possibly the next step to treat this patient's autoimmune      process would be immunosuppression with CellCept or Cytoxan versus      a splenectomy.  Of note, the patient has had 4 doses of Rituximab      with poor response.  Decision has been made to transfer the patient      to [facility] where specialists  are more readily available and to where      the patient's case is better known to further manage this illness.  2. Thrombocytopenia.  On presentation, the patient's platelets were      130s.  They remained stable over the first 2 days but on day of      discharge the patient's platelets dropped to 78.  Repeat CBC is      pending to further monitor platelets.  3. Anemia.  See #1.  Of note, the patient has a baseline anemia about      8-9 hemoglobin.  Per records, the patient starts becoming      symptomatic at a hemoglobin of 7, so, when hemoglobin reaches 7.5      he receives transfusion.  Of note, as well the  patient has history      of needing multiple transfusions every 3 weeks to maintain      hemoglobin.  4. Antiphospholipid antibody syndrome.  The patient initially      discontinued from Coumadin therapy secondary to possibility of INR      not being reliable to monitor Coumadin levels.  The patient was      started on Lovenox 100 mg subcutaneously b.i.d. and this was      continued during this hospitalization.  5. History of PE.  The patient is currently on Lovenox and this was      continued.   DISPOSITION:  The patient is currently awaiting bed availability for  transfer to a step-down at [facility].  Vital signs throughout this  hospitalization have been stable.  Heart rate ranges from 50s to 70s and  blood pressure ranges on day of discharge from 120s-130s systolic.  On  admission, the patient did have slightly low blood pressures at 90  systolic.  The patient's oxygen had been stable at 100% on room air.      Eustaquio Boyden, MD  Electronically Signed      Nestor Ramp, MD  Electronically Signed    JG/MEDQ  D:  03/04/2008  T:  03/04/2008  Job:  [id number]

## 2011-01-24 NOTE — Discharge Summary (Signed)
NAMEFRITZ, CAUTHON NO.:  [id number]   MEDICAL RECORD NO.:  [id number]          PATIENT TYPE:  INP   LOCATION:  1523                         FACILITY[facility]  [facility]   PHYSICIAN:  Madaline Savage, MD        DATE OF BIRTH:  02-16-1961   DATE OF ADMISSION:  11/08/2007  DATE OF DISCHARGE:  11/09/2007                               DISCHARGE SUMMARY   PRIMARY CARE PHYSICIAN:  Dr. Sherrlyn Hock; this patient is unassigned to [place].   DISCHARGE DIAGNOSES:  1. Nephrolithiasis with obstructive uropathy.  2. History of hemolytic anemia status post transfusion.  3. History of pulmonary embolism on Coumadin.   DISCHARGE MEDICATIONS:  1. Ciprofloxacin 500 mg twice daily for five more days.  2. Percocet one tablet twice daily as needed.  3. Zofran 4 mg three times daily as needed.  4. Prednisone 10 mg daily.  5. Coumadin 5 mg daily.  6. Prilosec 20 mg daily.   HISTORY OF PRESENT ILLNESS:  For full history and physical see the  history and physical dictated by Dr. Salome Arnt on October 08, 2007.  In  short, Mr. Vandeusen is a 50 year old gentleman with a history of  hemolytic anemia and pulmonary embolism who was on Coumadin who comes in  with sudden onset of right flank pain.  His initial evaluation including  a CT scan showed that he had obstructive uropathy with punctate stones  in his right ureter.  He was admitted for further evaluation and  management.   PROCEDURES DONE IN THE HOSPITAL:  He had a CT of the abdomen and pelvis  done with contrast on November 08, 2007, which shows decreased  enhancement and contrast media excretion by the left kidney which is  hydronephrotic exhibiting perinephric fat stranding.  Findings  consistent with acute obstructive uropathy secondary to punctate stones  in the distal left ureter.  Stable hypodensities in both kidney and  liver which are too small to characterize.  The pelvis CT did show left-  sided hydroureter with periureteric fat stranding  which is likely  secondary to obstructive uropathy.   PROBLEM LIST:  1. Nephrolithiasis with obstructive uropathy.  Mr. Lautner was      admitted and was started on hydration and empirically started on      antibiotics.  He had pain at the time of admission but his pain      resolved within a few hours.  At the time of discharge he has been      pain free for more than 14 hours.  He has not required any pain      medications.  He did not have any nausea or vomiting.  We consulted      Dr. Wanda Plump from urology.  Dr. Wanda Plump agreed that the patient      can be discharged home and Dr. Wanda Plump will see him as an      outpatient in 1 week.  At this time we will empirically treat him      with ciprofloxacin for five more days.  He has most likely passed  his stone.  At the time of discharge his creatinine is 1.3.  2. Hemolytic anemia.  Mr. Wessells had a hemoglobin of 7.2 on      admission.  He states his baseline is approximately 7-9.  We      transfused him 2 units and his hemoglobin came up to 8.9 at the      time of discharge.  He is asked to keep his regular appointment      with his hematologist.  3. History of pulmonary embolism.  He will continue on Coumadin as      before.   DISPOSITION:  He is now being discharged home in stable condition.  He  is advised to take more fluids.  He is also advised to strain his urine  at home.   FOLLOWUP:  He is asked to follow up with Dr. Wanda Plump in 1 week.  He is  given Dr. Wanda Plump' phone number, which is [phone].      Madaline Savage, MD  Electronically Signed     PKN/MEDQ  D:  11/09/2007  T:  11/09/2007  Job:  [id number]   cc:   [place] [facility], M.D.  Fax: [phone]

## 2011-01-27 NOTE — Op Note (Signed)
NAME:  IRAN, ROWE                       ACCOUNT NO.:  [id number]   MEDICAL RECORD NO.:  [id number]                   PATIENT TYPE:  AMB   LOCATION:  DSC                                  FACILITY:  [facility]   PHYSICIAN:  Loreta Ave, M.D.              DATE OF BIRTH:  02/23/1961   DATE OF PROCEDURE:  05/04/2004  DATE OF DISCHARGE:                                 OPERATIVE REPORT   PREOPERATIVE DIAGNOSES:  Left shoulder rotator cuff tear with impingement  and degenerative joint disease acromioclavicular joint.  Possible  SLAP  lesion, proximal biceps tendon.   POSTOPERATIVE DIAGNOSES:  Left shoulder rotator cuff tear with impingement  and degenerative joint disease acromioclavicular joint.  Type 2 SLAP lesion,  not requiring repair but debridement of partial tearing, biceps tendon.   OPERATION PERFORMED:  Left shoulder examination under anesthesia,  arthroscopy, debridement of biceps and assessment of possible slap lesion.  Debridement of rotator cuff above and below.  Bursectomy and acromioplasty,  coracoacromial ligament release.  Excision of distal clavicle.  Open repair  of rotator cuff tear with FiberWire suture and Concept repair system.   SURGEON:  Loreta Ave, M.D.   ASSISTANT:  Arlys John D. Petrarca, P.A.-C.   ANESTHESIA:  General.   ESTIMATED BLOOD LOSS:  Minimal.   SPECIMENS:  None.   CULTURES:  None.   COMPLICATIONS:  None.   DRESSING:  Soft compressive with shoulder immobilizer.   DESCRIPTION OF PROCEDURE:  The patient was brought to the operating room and  placed on the operating table in supine position.  After adequate anesthesia  had been obtained, the left shoulder was examined.  Full motion.  Good  stability.  Placed in a beach chair position on the shoulder positioner,  prepped and draped.  Three standard arthroscopic portals, anterior,  posterior lateral.  Shoulder entered with blunt obturator, distended and  inspected.  Partial tearing  biceps tendon at the attachment with a little  mobility but not a true SLAP lesion requiring repair.  Biceps debrided.  Superior labrum debrided.  Remaining labrum, articular cartilage, capsular  ligamentous structures intact.  Biceps tendon well within the groove.  The  subscap and infraspinatus intact.  Full thickness tear supraspinatus only  minimal retraction but in a crucial tear all the way across the  supraspinatus.  Displaced intra-articular fragments debrided.  Cannula  redirected subacromially.  Type 3 acromion with impingement and a roughening  of the entire cuff.  Tear was assessed and very reparable.  Acromioplasty to  a type 1 acromion with a shaver and a high speed bur releasing CA ligament  with cautery.  Distal clavicle grade 4 changes and spurs.  All spurs and  lateral centimeter of clavicle resected.  Adequacy of decompression  confirmed viewing from all portals.  Instruments and fluid removed.  Deltoid  splitting incision laterally through the lateral portal.  Subacromial space  assessed.  Adequacy of  decompression confirmed.  Cuff debrided.  Adequacy of  decompression confirmed.  Cuff debrided back to healthy tissue.  Bony cross  created in the humerus at the attachment site.  #2 FiberWire was then weaved  into the cuff to well capture this and it was mobilized for repair.  Sutures  were then passed through drill holes with the Concept repair system.  The  arm was abducted and sutures tied over bony bridge.  Nice firm watertight  closure of the cuff.  Good motion and full adduction of the shoulder without  too much tension on the repair.  Wound irrigated.  Deltoid closed with  Vicryl.  Skin closed with subcutaneous subcuticular Vicryl and portals  closed with nylon.  Margins of the wound injected with Marcaine.  Sterile  compressive dressing applied.  Shoulder immobilizer applied.  Anesthesia  reversed.  Brought to recovery room.  Tolerated surgery well.  No   complications.                                               Loreta Ave, M.D.    DFM/MEDQ  D:  05/04/2004  T:  05/04/2004  Job:  [id number]

## 2011-01-27 NOTE — Assessment & Plan Note (Signed)
[facility]                         GASTROENTEROLOGY OFFICE NOTE   Mark Randolph, Mark Randolph                    MRN:          [id number]  DATE:11/01/2007                            DOB:          Jan 26, 1961    PROCEDURE:  Small bowel capsule endoscopy.   INDICATIONS:  Anemia and heme-positive stool though negative EGD and  endoscopy (Dr. Dairl Ponder in [place]).   FINDINGS:  This is a completed study.  There was a good prep.  There  were 3 red spots that could be subtle arteriovenous malformations, but  otherwise normal.   There was no clear cause of anemia and heme-positive stool.  Further  plan per Dr. Dorthula Perfect and Dr. Hyacinth Meeker in [place].   Please see the computer-generated report for further details.     Iva Boop, MD,FACG  Electronically Signed    CEG/MedQ  DD: 11/12/2007  DT: 11/12/2007  Job #: [id number]

## 2011-06-02 LAB — BASIC METABOLIC PANEL
BUN: 10
CO2: 28
Calcium: 8.3 — ABNORMAL LOW
Chloride: 100
Creatinine, Ser: 1.39
GFR calc Af Amer: >60
Glucose, Bld: 87

## 2011-06-02 LAB — PROTIME-INR
INR: 2.7 — ABNORMAL HIGH
INR: 2.8 — ABNORMAL HIGH
Prothrombin Time: 29.4 — ABNORMAL HIGH
Prothrombin Time: 30.1 — ABNORMAL HIGH

## 2011-06-02 LAB — URINALYSIS, ROUTINE W REFLEX MICROSCOPIC
Nitrite: NEGATIVE
Protein, ur: 100 — AB
Specific Gravity, Urine: 1.027
Urobilinogen, UA: 1

## 2011-06-02 LAB — CBC
HCT: 20 — ABNORMAL LOW
Hemoglobin: 7.2 — CL
MCHC: 35.7
MCHC: 35.9
MCV: 88.1
MCV: 88.4
Platelets: 273
RBC: 2.83 — ABNORMAL LOW
RDW: 16 — ABNORMAL HIGH
RDW: 16.4 — ABNORMAL HIGH

## 2011-06-02 LAB — CROSSMATCH: Antibody Screen: NEGATIVE

## 2011-06-02 LAB — COMPREHENSIVE METABOLIC PANEL
BUN: 15
Calcium: 8.8
Creatinine, Ser: 1.31
GFR calc non Af Amer: 58 — ABNORMAL LOW
Glucose, Bld: 140 — ABNORMAL HIGH
Sodium: 137
Total Protein: 6.6

## 2011-06-02 LAB — DIFFERENTIAL
Lymphocytes Relative: 9 — ABNORMAL LOW
Lymphs Abs: 0.2 — ABNORMAL LOW
Monocytes Relative: 5
Neutro Abs: 2
Neutrophils Relative %: 86 — ABNORMAL HIGH

## 2011-06-02 LAB — URINE MICROSCOPIC-ADD ON

## 2011-06-02 LAB — LIPASE, BLOOD: Lipase: 22

## 2011-06-02 LAB — URINE CULTURE: Colony Count: NO GROWTH

## 2011-06-08 LAB — CBC
HCT: 23.6 — ABNORMAL LOW
HCT: 29.4 — ABNORMAL LOW
Hemoglobin: 10.3 — ABNORMAL LOW
Hemoglobin: 8 — ABNORMAL LOW
Hemoglobin: 8.2 — ABNORMAL LOW
Hemoglobin: 8.4 — ABNORMAL LOW
MCHC: 34.9
MCHC: 35.7
MCV: 85.6
MCV: 86.5
Platelets: 132 — ABNORMAL LOW
RBC: 2.79 — ABNORMAL LOW
RDW: 16.4 — ABNORMAL HIGH
RDW: 17.5 — ABNORMAL HIGH
WBC: 5.7
WBC: 6

## 2011-06-08 LAB — CULTURE, BLOOD (ROUTINE X 2)
Culture: NO GROWTH
Culture: NO GROWTH

## 2011-06-08 LAB — DIFFERENTIAL
Basophils Absolute: 0
Basophils Relative: 0
Eosinophils Absolute: 0
Monocytes Absolute: 0.2
Monocytes Relative: 4
Neutro Abs: 4.3
Neutrophils Relative %: 87 — ABNORMAL HIGH

## 2011-06-08 LAB — POCT I-STAT, CHEM 8
BUN: 16
Calcium, Ion: 1.06 — ABNORMAL LOW
Chloride: 104
Glucose, Bld: 115 — ABNORMAL HIGH
TCO2: 27

## 2011-06-08 LAB — URINALYSIS, ROUTINE W REFLEX MICROSCOPIC
Ketones, ur: NEGATIVE
Nitrite: NEGATIVE
Protein, ur: NEGATIVE
pH: 5.5

## 2011-06-08 LAB — PROTIME-INR
INR: 1.2
Prothrombin Time: 15.4 — ABNORMAL HIGH

## 2011-06-08 LAB — BILIRUBIN, FRACTIONATED(TOT/DIR/INDIR): Total Bilirubin: 2.1 — ABNORMAL HIGH

## 2011-06-08 LAB — COMPREHENSIVE METABOLIC PANEL
ALT: 17
AST: 13
Albumin: 3.3 — ABNORMAL LOW
Alkaline Phosphatase: 79
Calcium: 8.7
GFR calc Af Amer: >60
Potassium: 3.7
Sodium: 142
Total Protein: 5.9 — ABNORMAL LOW

## 2011-06-08 LAB — URINE CULTURE
Colony Count: NO GROWTH
Culture: NO GROWTH

## 2011-06-08 LAB — BASIC METABOLIC PANEL
GFR calc non Af Amer: >60
Potassium: 4.4
Sodium: 140

## 2011-06-08 LAB — APTT: aPTT: 140 — ABNORMAL HIGH

## 2011-06-08 LAB — C-REACTIVE PROTEIN: CRP: 3.2 — ABNORMAL HIGH (ref <0.6)

## 2011-06-08 LAB — SEDIMENTATION RATE: Sed Rate: 70 — ABNORMAL HIGH

## 2011-09-20 ENCOUNTER — Other Ambulatory Visit: Payer: Self-pay | Admitting: Dermatology

## 2012-01-24 ENCOUNTER — Other Ambulatory Visit (HOSPITAL_COMMUNITY): Payer: Self-pay | Admitting: *Deleted

## 2012-01-24 DIAGNOSIS — R161 Splenomegaly, not elsewhere classified: Secondary | ICD-10-CM

## 2012-01-25 ENCOUNTER — Other Ambulatory Visit (HOSPITAL_COMMUNITY): Payer: Self-pay | Admitting: *Deleted

## 2012-01-25 ENCOUNTER — Ambulatory Visit (HOSPITAL_COMMUNITY)
Admission: RE | Admit: 2012-01-25 | Discharge: 2012-01-25 | Disposition: A | Discharge status: Home / Self Care | Payer: 59 | Source: Ambulatory Visit | Attending: *Deleted | Admitting: *Deleted

## 2012-01-25 DIAGNOSIS — R161 Splenomegaly, not elsewhere classified: Secondary | ICD-10-CM | POA: Insufficient documentation

## 2012-10-07 ENCOUNTER — Ambulatory Visit (INDEPENDENT_AMBULATORY_CARE_PROVIDER_SITE_OTHER): Payer: 59 | Admitting: Family Medicine

## 2012-10-07 ENCOUNTER — Encounter: Payer: Self-pay | Admitting: Family Medicine

## 2012-10-07 VITALS — Ht 69.0 in | Wt 234.9 lb

## 2012-10-07 DIAGNOSIS — E66811 Obesity, class 1: Secondary | ICD-10-CM

## 2012-10-07 DIAGNOSIS — E669 Obesity, unspecified: Secondary | ICD-10-CM

## 2012-10-07 NOTE — Progress Notes (Signed)
Medical Nutrition Therapy:  Appt start time: 1100 end time:  1200.  Assessment:  Primary concerns today: Weight management.  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  He is now motivated to make a significant effort at managing his health and weight.   Usual eating pattern includes 3 meals and 1 snack per day. Usual physical activity includes 30 min cardio & 30 min weights at the gym 4 X wk.  Working with the [employer] system at [employer], his daily work entails mostly sitting at a computer.   Mark Randolph likes to cook, and does prepare much of his own food.   Everyday foods include Cheerios & 6 oz grapefruit juice for bkfst, 2 c black coffee, 12 oz diet Coke 3 X wk, & deli meat sandwich for lunch.  Avoided foods include none.    24-hr recall: (Up at 8) B (8:30 AM)-   2 scrmbld eggs, 1 toast, 1 tsp butter, 1 tsp jelly, 6 oz grapefruit juice, 2 c coffee Snk ( AM)-   none L (1 PM)-  8" Firehouse pastrami, cheese, mustard sub, chips, 16 oz diet soda Snk (4 PM)-  Handful of peanuts, water D (7 PM)-  6 oz rotisserie pork loin, 1 c roasted cauliflower, 1 c asparagus w/ butter, 1 glass wine Snk ( PM)-  none  Progress Towards Goal(s):  In progress.   Nutritional Diagnosis:  NI-1.5 Excessive energy intake As related to expenditure.  As evidenced by BMI >30.    Intervention:  Nutrition education.  Monitoring/Evaluation:  Dietary intake, exercise, and body weight in 3 week(s).

## 2012-10-07 NOTE — Patient Instructions (Addendum)
-   For each meal, plate your whole meal, sit down, and then eat.    - Your meal should look like, taste like, and feel like a REAL MEAL.   - Include protein with each meal.   - For cereals, look for at least 5 grams of fiber per serving.  Mix a higher-fiber cereal with lower-fiber, if desired.    - Also consider using frozen berries on your cereal (in winter).   - Whole fruit instead of fruit juice will be a better choice.   - Obtain twice as many veg's as protein or carbohydrate foods for both lunch and dinner. - Keep a daily food record for at least a week; bring to [facility] or email to [facility] to review.    - Physical activity:  Continue at least 1 hour at the gym 4 X wk. (Recommend:  Keep a written log for now.)  - ALSO:  Move as much as you can during the day, i.e., micro-breaking 5 min for every hour you sit OR 1 minute for every 20 min you sit.    - Daily tracking your activity can help you get into a routine with this behavior.

## 2012-10-26 ENCOUNTER — Other Ambulatory Visit: Payer: Self-pay

## 2012-10-28 ENCOUNTER — Encounter: Payer: Self-pay | Admitting: Family Medicine

## 2012-10-28 ENCOUNTER — Ambulatory Visit (INDEPENDENT_AMBULATORY_CARE_PROVIDER_SITE_OTHER): Payer: 59 | Admitting: Family Medicine

## 2012-10-28 VITALS — Ht 69.0 in | Wt 234.1 lb

## 2012-10-28 DIAGNOSIS — E669 Obesity, unspecified: Secondary | ICD-10-CM

## 2012-10-28 NOTE — Progress Notes (Signed)
Medical Nutrition Therapy:  Appt start time: 1000 end time:  1100.  Assessment:  Primary concerns today: Weight management.  Mark Randolph has realized that his eating behavior changes significantly on weekends, starting with a glass of wine and pre-dinner snacks on Friday night.  He has been getting to the gym 4 X wk, and he has been trying to move around more while at work.  Although weight has not changed much, he feels his clothes are fitting better.    24-hr recall:  (Up at 7:30 AM) B (8 AM)-  1 waffle w/ 2 tbsp syrup, 1 tbsp margarine, 2 sausage patties, black coffee Snk ( AM)-  None (walked 40 min at 11:30 AM) L (12:30)-  1/2 baked chx brst, small flour tortilla, salsa, water Snk (4,6 PM)-  1 tangerine, carrots D (7 PM)-  2 smoked ribs, 1 c roasted potatoes & peppers, salad w/ 1 tsp sunflower seeds, 2 tbsp dressing, 1 glass wine Snk ( PM)-  none  Progress Towards Goal(s):  In progress.   Nutritional Diagnosis:  NI-1.5 Excessive energy intake As related to expenditure.  As evidenced by BMI >30.    Intervention:  Nutrition education.  Monitoring/Evaluation:  Dietary intake, exercise, and body weight in 4 week(s).

## 2012-10-28 NOTE — Patient Instructions (Addendum)
-   Happy Hour:    - Cheese:  Use low-fat when possible; use full-flavored, i.e., aged sharp cheddar; use a cheese slicer, and cut small; plate the amount of crackers & cheese you plan to eat.    - Salami:  Look for lowest fat version you can find; experiment with other meats; cut thinly.  [[employer] brand?]  - Wine:  Limit to 1-2 glasses; 1 glass = 5 oz. - Low-sodium chicken broth:  [employer] brand; [employer] 365, [employer] brand. - Beans:  [employer] no salt-added version.   - Vegetables at both lunch and dinner. - Pay attn to your appetite and how eating in the first two meals of the day affect your dinner appetite.    - Write down what you have breakfast, how much, and what you ate.  Then add:  Time at which you first notice hunger.  Bring to follow-up.

## 2012-11-25 ENCOUNTER — Encounter: Payer: Self-pay | Admitting: Family Medicine

## 2012-11-25 ENCOUNTER — Ambulatory Visit (INDEPENDENT_AMBULATORY_CARE_PROVIDER_SITE_OTHER): Payer: 59 | Admitting: Family Medicine

## 2012-11-25 VITALS — Ht 69.0 in | Wt 225.1 lb

## 2012-11-25 DIAGNOSIS — E669 Obesity, unspecified: Secondary | ICD-10-CM

## 2012-11-25 DIAGNOSIS — E66811 Obesity, class 1: Secondary | ICD-10-CM
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
## 2012-11-25 NOTE — Patient Instructions (Addendum)
-   Continue to eat enough and the right foods for breakfast especially, and have healthy snacks on hand.  - PLANNING AHEAD will be key to success in continued weight loss.   - Emphasize:  veg's at both lunch and dinner; fruits for snacks; regular exercise; and daily food record.   - Google [employer], and check out how our environment affects our food choices.   - Wt Chk April 14 at 10 AM

## 2013-03-03 ENCOUNTER — Other Ambulatory Visit (HOSPITAL_COMMUNITY): Payer: Self-pay | Admitting: Orthopedic Surgery

## 2013-03-03 DIAGNOSIS — M25511 Pain in right shoulder: Secondary | ICD-10-CM

## 2013-03-06 ENCOUNTER — Ambulatory Visit (HOSPITAL_COMMUNITY)
Admission: RE | Admit: 2013-03-06 | Discharge: 2013-03-06 | Disposition: A | Discharge status: Home / Self Care | Payer: 59 | Source: Ambulatory Visit | Attending: Orthopedic Surgery | Admitting: Orthopedic Surgery

## 2013-03-06 DIAGNOSIS — M75100 Unspecified rotator cuff tear or rupture of unspecified shoulder, not specified as traumatic: Secondary | ICD-10-CM | POA: Insufficient documentation

## 2013-03-06 DIAGNOSIS — M25511 Pain in right shoulder: Secondary | ICD-10-CM

## 2013-03-06 DIAGNOSIS — IMO0002 Reserved for concepts with insufficient information to code with codable children: Secondary | ICD-10-CM | POA: Insufficient documentation

## 2013-03-06 DIAGNOSIS — G8929 Other chronic pain: Secondary | ICD-10-CM | POA: Insufficient documentation

## 2013-03-06 DIAGNOSIS — M19019 Primary osteoarthritis, unspecified shoulder: Secondary | ICD-10-CM | POA: Insufficient documentation

## 2013-03-07 ENCOUNTER — Other Ambulatory Visit (HOSPITAL_COMMUNITY): Payer: 59

## 2013-07-17 ENCOUNTER — Other Ambulatory Visit: Payer: Self-pay

## 2014-02-16 ENCOUNTER — Other Ambulatory Visit: Payer: Self-pay | Admitting: Dermatology

## 2014-08-28 ENCOUNTER — Encounter: Payer: Self-pay | Admitting: Sports Medicine

## 2014-08-28 ENCOUNTER — Ambulatory Visit (INDEPENDENT_AMBULATORY_CARE_PROVIDER_SITE_OTHER): Payer: 59 | Admitting: Sports Medicine

## 2014-08-28 VITALS — BP 140/78 | Ht 69.0 in | Wt 220.0 lb

## 2014-08-28 DIAGNOSIS — M545 Low back pain: Secondary | ICD-10-CM

## 2014-08-28 DIAGNOSIS — M7918 Myalgia, other site: Secondary | ICD-10-CM

## 2014-08-28 DIAGNOSIS — M542 Cervicalgia: Secondary | ICD-10-CM

## 2014-08-28 MED ORDER — PREDNISONE 10 MG PO TABS: ORAL_TABLET | ORAL | Status: DC

## 2014-08-28 NOTE — Progress Notes (Signed)
Mark Randolph - 53 y.o. male MRN [id number]  Date of birth: 03-26-1961  SUBJECTIVE:  Including CC & ROS.  Is a 53 year old gentleman who presents today for evaluation of acute onset cervical neck pain with mild but intermittent radiculopathy into his arm which he describes as numbness and tingling. Patient reports the pain is started waking up with neck spasm on Monday. He feels that throughout the week it has improved some but is still present. He also describes some discomfort in his upper traps. Denies any pain in the shoulder no decreased range of motion or weakness. Denies any previous trauma or injury that provoked his neck pain. He also describes some tightness in the low without radicular symptoms. Really with activity such as running along time on airplane or driving the car. He localizes to his low back with some radiation into his buttocks. Tried some anti-inflammatories with Aleve with some relief. Denies doing any stretching or rehabilitative type exercises.   ROS: Review of systems otherwise negative except for information present in HPI  HISTORY: Past Medical, Surgical, Social, and Family History Reviewed & Updated per EMR. Pertinent Historical Findings include: Obesity, history of PE History of bilateral rotator cuff arthroscopic surgery for repair of rotator cuff tears  DATA REVIEWED: Patient has had no imaging of his cervical spine  PHYSICAL EXAM:  VS: BP:140/78 mmHg  HR: bpm  TEMP: ( )  RESP:   HT:5\' 9"  (175.3 cm)   WT:220 lb (99.791 kg)  BMI:32.6 UPPER BACK EXAM: General: well nourished Skin of UE: warm; dry, no rashes, lesions, ecchymosis or erythema. Vascular: radial pulses 2+ bilaterally Observation: Normal curvature no excessive lordosis or kyphosis or scoliosis.  Shoulders are aligned, tips of scapula are symmetric Palpation: No step off defects throughout the cervical or thoracic spine.  No significant paraspinal muscle tenderness. Range of motion: Normal  shoulder range of motion.  Normal range of motion in flexion, extension, rotation of the neck. Special tests: positive left Spurling sign: with radiation of numbness in the hand  OMT assessment: Muscle spasm and tenderness along the cervical and upper traps. C3-C5 rotated left side than left. Within the thoracic side spine T5-T8 rotated right side than left. Adjustments were made with muscle energy in the cervical spine. Thoracic dysfunctions were corrected with HVLA. Lumbar spine did not reveal any significant dysfunction no evidence of asymmetry in leg length. Left pelvis slightly up slip. Lumbar spine was corrected with muscle energy. Motor and sensory: Shoulder Abduction (C5) Intact Elbow Flexion (C6) intact Shoulder Extension above head (C7) intact Forearm Pronation - C7/8 intact Wrist Extension (C6) intact Wrist Flexion (C7) intact Fingers Extension/ Flexion (C7, C8) intact Finger Abduction/adduction (T1) intact  ASSESSMENT & PLAN: See problem based charting & AVS for pt instructions.  Impression: Cervical muscle spasm with evidence of radiculopathy likely at C6-C7. No apparent weakness. Because of the acute onset of this injury we treated with muscle energy and counterstrain. Provided patient with home exercises for neck stretching and scapular stretching. Eyes patient that after treating with OT, home stretching and as well a steroid Dose pak if symptoms persist or worsen we would consider proceeding with x-rays and possibly MRI depending on how aggressive he would like to be with his treatment. Patient will follow-up in a month and will reassess and consider imaging at that time. Concerning his low back is general tightness in the hamstrings and hip flexors but no obvious dysfunction. No radicular symptoms concerning for lumbar spine pathology. Recommended some low  back stretching and strengthening exercises

## 2015-07-15 ENCOUNTER — Encounter: Payer: Self-pay | Admitting: Internal Medicine

## 2017-10-15 ENCOUNTER — Telehealth: Payer: Self-pay | Admitting: Internal Medicine

## 2017-10-15 NOTE — Telephone Encounter (Signed)
Last procedure was a flex in 07/2008.  Please advise recall date

## 2017-10-17 NOTE — Telephone Encounter (Signed)
Left message for patient to return my call.

## 2017-10-17 NOTE — Telephone Encounter (Signed)
It is time for a screening colonoscopy As long as not on blood thinners schedule direct please

## 2018-04-19 ENCOUNTER — Encounter: Payer: Self-pay | Admitting: Internal Medicine

## 2018-05-07 ENCOUNTER — Encounter: Payer: Self-pay | Admitting: Internal Medicine

## 2018-07-11 ENCOUNTER — Encounter: Payer: 59 | Admitting: Internal Medicine

## 2019-04-21 ENCOUNTER — Encounter: Payer: Self-pay | Admitting: Internal Medicine

## 2019-05-01 ENCOUNTER — Ambulatory Visit (AMBULATORY_SURGERY_CENTER): Payer: Self-pay | Admitting: *Deleted

## 2019-05-01 ENCOUNTER — Other Ambulatory Visit: Payer: Self-pay

## 2019-05-01 VITALS — Temp 96.8°F | Ht 68.0 in | Wt 205.0 lb

## 2019-05-01 DIAGNOSIS — Z1211 Encounter for screening for malignant neoplasm of colon: Secondary | ICD-10-CM

## 2019-05-01 NOTE — Progress Notes (Signed)
No egg or soy allergy known to patient  No issues with past sedation with any surgeries  or procedures, no past  Intubation   No diet pills per patient No home 02 use per patient  No blood thinners per patient  Pt denies issues with constipation  No A fib or A flutter  EMMI video sent to pt's e mail

## 2019-05-09 ENCOUNTER — Encounter: Payer: Self-pay | Admitting: Internal Medicine

## 2019-05-14 ENCOUNTER — Encounter: Payer: Self-pay | Admitting: Internal Medicine

## 2019-05-15 ENCOUNTER — Telehealth: Payer: Self-pay

## 2019-05-15 NOTE — Telephone Encounter (Signed)
Covid-19 screening questions   Do you now or have you had a fever in the last 14 days?  Do you have any respiratory symptoms of shortness of breath or cough now or in the last 14 days?  Do you have any family members or close contacts with diagnosed or suspected Covid-19 in the past 14 days?  Have you been tested for Covid-19 and found to be positive?       

## 2019-05-16 ENCOUNTER — Ambulatory Visit (AMBULATORY_SURGERY_CENTER): Payer: Managed Care, Other (non HMO) | Admitting: Internal Medicine

## 2019-05-16 ENCOUNTER — Encounter: Payer: Self-pay | Admitting: Internal Medicine

## 2019-05-16 ENCOUNTER — Other Ambulatory Visit: Payer: Self-pay

## 2019-05-16 VITALS — BP 109/64 | HR 51 | Temp 97.7°F | Resp 16 | Ht 68.0 in | Wt 205.0 lb

## 2019-05-16 DIAGNOSIS — Z1211 Encounter for screening for malignant neoplasm of colon: Secondary | ICD-10-CM

## 2019-05-16 MED ORDER — SODIUM CHLORIDE 0.9 % IV SOLN: 500.0000 mL | Freq: Once | INTRAVENOUS | Status: AC

## 2019-05-16 NOTE — Progress Notes (Signed)
Pt's states no medical or surgical changes since previsit or office visit.  Temp- June Vitals- courtney 

## 2019-05-16 NOTE — Progress Notes (Signed)
A and O x3. Report to RN. Tolerated MAC anesthesia well.

## 2019-05-16 NOTE — Patient Instructions (Signed)
Impression/Recommendations:  Diverticulosis handout given to patient.  Repeat colonoscopy in 10 years for screening purposes. Resume previous diet. Continue present medications.  YOU HAD AN ENDOSCOPIC PROCEDURE TODAY AT [facility]:   Refer to the procedure report that was given to you for any specific questions about what was found during the examination.  If the procedure report does not answer your questions, please call your gastroenterologist to clarify.  If you requested that your care partner not be given the details of your procedure findings, then the procedure report has been included in a sealed envelope for you to review at your convenience later.  YOU SHOULD EXPECT: Some feelings of bloating in the abdomen. Passage of more gas than usual.  Walking can help get rid of the air that was put into your GI tract during the procedure and reduce the bloating. If you had a lower endoscopy (such as a colonoscopy or flexible sigmoidoscopy) you may notice spotting of blood in your stool or on the toilet paper. If you underwent a bowel prep for your procedure, you may not have a normal bowel movement for a few days.  Please Note:  You might notice some irritation and congestion in your nose or some drainage.  This is from the oxygen used during your procedure.  There is no need for concern and it should clear up in a day or so.  SYMPTOMS TO REPORT IMMEDIATELY:   Following lower endoscopy (colonoscopy or flexible sigmoidoscopy):  Excessive amounts of blood in the stool  Significant tenderness or worsening of abdominal pains  Swelling of the abdomen that is new, acute  Fever of 100F or higher For urgent or emergent issues, a gastroenterologist can be reached at any hour by calling [phone].   DIET:  We do recommend a small meal at first, but then you may proceed to your regular diet.  Drink plenty of fluids but you should avoid alcoholic beverages for 24  hours.  ACTIVITY:  You should plan to take it easy for the rest of today and you should NOT DRIVE or use heavy machinery until tomorrow (because of the sedation medicines used during the test).    FOLLOW UP: Our staff will call the number listed on your records 48-72 hours following your procedure to check on you and address any questions or concerns that you may have regarding the information given to you following your procedure. If we do not reach you, we will leave a message.  We will attempt to reach you two times.  During this call, we will ask if you have developed any symptoms of COVID 19. If you develop any symptoms (ie: fever, flu-like symptoms, shortness of breath, cough etc.) before then, please call [phone].  If you test positive for Covid 19 in the 2 weeks post procedure, please call and report this information to [place].    If any biopsies were taken you will be contacted by phone or by letter within the next 1-3 weeks.  Please call us at [phone] if you have not heard about the biopsies in 3 weeks.    SIGNATURES/CONFIDENTIALITY: You and/or your care partner have signed paperwork which will be entered into your electronic medical record.  These signatures attest to the fact that that the information above on your After Visit Summary has been reviewed and is understood.  Full responsibility of the confidentiality of this discharge information lies with you and/or your care-partner.

## 2019-05-16 NOTE — Op Note (Signed)
[facility] Patient Name: Mark RubensteinRicardo Randolph Procedure Date: 05/16/2019 9:07 AM MRN: [id number] Endoscopist: Iva Booparl E Gessner , MD Age: 58 Referring MD:  Date of Birth: August 29, 1961 Gender: Male Account #: [id number] Procedure:                Colonoscopy Indications:              Screening for colorectal malignant neoplasm Medicines:                Propofol per Anesthesia, Monitored Anesthesia Care Procedure:                Pre-Anesthesia Assessment:                           - Prior to the procedure, a History and Physical                            was performed, and patient medications and                            allergies were reviewed. The patient's tolerance of                            previous anesthesia was also reviewed. The risks                            and benefits of the procedure and the sedation                            options and risks were discussed with the patient.                            All questions were answered, and informed consent                            was obtained. Prior Anticoagulants: The patient has                            taken no previous anticoagulant or antiplatelet                            agents. ASA Grade Assessment: II - A patient with                            mild systemic disease. After reviewing the risks                            and benefits, the patient was deemed in                            satisfactory condition to undergo the procedure.                           After obtaining informed consent, the colonoscope  was passed under direct vision. Throughout the                            procedure, the patient's blood pressure, pulse, and                            oxygen saturations were monitored continuously. The                            Colonoscope was introduced through the anus and                            advanced to the the cecum, identified by   appendiceal orifice and ileocecal valve. The                            ileocecal valve, appendiceal orifice, and rectum                            were photographed. The quality of the bowel                            preparation was excellent. The bowel preparation                            used was Miralax via split dose instruction. Scope In: 9:29:56 AM Scope Out: 9:44:15 AM Scope Withdrawal Time: 0 hours 11 minutes 1 second  Total Procedure Duration: 0 hours 14 minutes 19 seconds  Findings:                 The perianal and digital rectal examinations were                            normal. Pertinent negatives include normal prostate                            (size, shape, and consistency).                           Multiple small and large-mouthed diverticula were                            found in the sigmoid colon.                           The exam was otherwise without abnormality on                            direct and retroflexion views. Complications:            No immediate complications. Estimated blood loss:                            None. Estimated Blood Loss:     Estimated blood loss: none. Impression:               -  Moderate diverticulosis in the sigmoid colon.                           - The examination was otherwise normal on direct                            and retroflexion views.                           - No specimens collected. Recommendation:           - Repeat colonoscopy in 10 years for screening                            purposes.                           - Resume previous diet.                           - Continue present medications. Iva Boop, MD 05/16/2019 9:54:32 AM This report has been signed electronically.

## 2019-05-21 ENCOUNTER — Telehealth: Payer: Self-pay

## 2019-05-21 NOTE — Telephone Encounter (Signed)
  Follow up Call-  Call back number 05/16/2019  Post procedure Call Back phone  # [phone]  Permission to leave phone message Yes  Some recent data might be hidden     Patient questions:  Do you have a fever, pain , or abdominal swelling? No. Pain Score  0 *  Have you tolerated food without any problems? Yes.    Have you been able to return to your normal activities? Yes.    Do you have any questions about your discharge instructions: Diet   No. Medications  No. Follow up visit  No.  Do you have questions or concerns about your Care? No.  Actions: * If pain score is 4 or above: No action needed, pain <4.  1. Have you developed a fever since your procedure? no  2.   Have you had an respiratory symptoms (SOB or cough) since your procedure? no  3.   Have you tested positive for COVID 19 since your procedure no  4.   Have you had any family members/close contacts diagnosed with the COVID 19 since your procedure?  no   If yes to any of these questions please route to Joylene John, RN and Alphonsa Gin, Therapist, sports.

## 2020-06-15 ENCOUNTER — Other Ambulatory Visit: Payer: Self-pay | Admitting: Physician Assistant

## 2020-06-15 DIAGNOSIS — H9311 Tinnitus, right ear: Secondary | ICD-10-CM

## 2020-07-02 ENCOUNTER — Ambulatory Visit: Payer: Managed Care, Other (non HMO)
